# Patient Record
Sex: Male | Born: 1990 | Race: White | Hispanic: No | Marital: Married | Smoking: Never smoker
Health system: Southern US, Community
[De-identification: ages and names within clinical notes are randomized; demographics above are authoritative.]

## PROBLEM LIST (undated history)

## (undated) HISTORY — PX: HERNIA REPAIR: SHX51

## (undated) HISTORY — PX: ADENOIDECTOMY: SUR15

---

## 2018-03-17 ENCOUNTER — Encounter: Payer: Self-pay | Admitting: Emergency Medicine

## 2018-03-17 ENCOUNTER — Other Ambulatory Visit: Payer: Self-pay

## 2018-03-17 ENCOUNTER — Ambulatory Visit
Admission: EM | Admit: 2018-03-17 | Discharge: 2018-03-17 | Disposition: A | Discharge status: Home / Self Care | Payer: 59 | Attending: Family Medicine | Admitting: Family Medicine

## 2018-03-17 DIAGNOSIS — R079 Chest pain, unspecified: Secondary | ICD-10-CM | POA: Diagnosis not present

## 2018-03-17 LAB — CBC WITH DIFFERENTIAL/PLATELET
Abs Immature Granulocytes: 0.02 10*3/uL (ref 0.00–0.07)
BASOS PCT: 1 %
Basophils Absolute: 0 10*3/uL (ref 0.0–0.1)
EOS PCT: 1 %
Eosinophils Absolute: 0.1 10*3/uL (ref 0.0–0.5)
HCT: 49.3 % (ref 39.0–52.0)
HEMOGLOBIN: 16.9 g/dL (ref 13.0–17.0)
Immature Granulocytes: 0 %
LYMPHS PCT: 30 %
Lymphs Abs: 1.9 10*3/uL (ref 0.7–4.0)
MCH: 29 pg (ref 26.0–34.0)
MCHC: 34.3 g/dL (ref 30.0–36.0)
MCV: 84.7 fL (ref 80.0–100.0)
MONO ABS: 0.3 10*3/uL (ref 0.1–1.0)
Monocytes Relative: 5 %
Neutro Abs: 3.9 10*3/uL (ref 1.7–7.7)
Neutrophils Relative %: 63 %
Platelets: 158 10*3/uL (ref 150–400)
RBC: 5.82 MIL/uL — AB (ref 4.22–5.81)
RDW: 11.9 % (ref 11.5–15.5)
WBC: 6.3 10*3/uL (ref 4.0–10.5)
nRBC: 0 % (ref 0.0–0.2)

## 2018-03-17 LAB — COMPREHENSIVE METABOLIC PANEL
ALBUMIN: 4.9 g/dL (ref 3.5–5.0)
ALK PHOS: 27 U/L — AB (ref 38–126)
ALT: 29 U/L (ref 0–44)
ANION GAP: 8 (ref 5–15)
AST: 24 U/L (ref 15–41)
BUN: 13 mg/dL (ref 6–20)
CALCIUM: 9.3 mg/dL (ref 8.9–10.3)
CO2: 27 mmol/L (ref 22–32)
CREATININE: 0.91 mg/dL (ref 0.61–1.24)
Chloride: 103 mmol/L (ref 98–111)
GFR calc Af Amer: >60 mL/min (ref >60)
GFR calc non Af Amer: >60 mL/min (ref >60)
GLUCOSE: 102 mg/dL — AB (ref 70–99)
Potassium: 3.8 mmol/L (ref 3.5–5.1)
SODIUM: 138 mmol/L (ref 135–145)
TOTAL PROTEIN: 7.6 g/dL (ref 6.5–8.1)
Total Bilirubin: 0.9 mg/dL (ref 0.3–1.2)

## 2018-03-17 LAB — TROPONIN I: Troponin I: <0.03 ng/mL (ref <0.03)

## 2018-03-17 MED ORDER — OMEPRAZOLE 20 MG PO CPDR: 40.0000 mg | DELAYED_RELEASE_CAPSULE | Freq: Every day | ORAL | Status: DC

## 2018-03-17 NOTE — Discharge Instructions (Addendum)
Labs were normal.  Likely anxiety with a possible component of GERD.   Increase Omeprazole to 40 mg daily for 2 weeks.  Close follow up.  No vaping.    [facility], [facility], [facility], [facility].

## 2018-03-17 NOTE — ED Provider Notes (Signed)
[facility]    CSN: [id number] Arrival date & time: 03/17/18  0905  History   Chief Complaint Chief Complaint  Patient presents with  . Chest Pain  . Generalized Body Aches   HPI  27 year old male presents with chest pain.  Patient reports intermittent chest pressure and sharp pain over the past 3 to 4 days.  Patient does have some belching and heartburn periodically.  Wife thought that GERD may be contributing and has placed him on Prilosec.  He has been doing this for the past 2 days.  He has had no resolution.  His discomfort is mild to moderate and is worse at night.  No reports of shortness of breath.  He is a non-smoker but does vape.  He has not seen a doctor in years.  Patient also reports some achiness of his left arm as well as left lower leg.  No reports of shortness of breath.  No fever or chills.  No known relieving factors.  No other reported symptoms.  No other complaints.  Of note, patient had a recent loss.  Patient a coworker who died suddenly after having cardiac issues.   PMH, Surgical Hx, Family Hx, Social History reviewed and updated as below.  No significant PMH.  Past Surgical History:  Procedure Laterality Date  . ADENOIDECTOMY    . HERNIA REPAIR     Home Medications    Prior to Admission medications   Medication Sig Start Date End Date Taking? Authorizing Provider  omeprazole (PRILOSEC) 20 MG capsule Take 2 capsules (40 mg total) by mouth daily. 03/17/18   Tommie Sams, DO   Family History Family History  Problem Relation Age of Onset  . Healthy Mother   . GER disease Father    Social History Social History   Tobacco Use  . Smoking status: Never Smoker  . Smokeless tobacco: Current User    Types: Snuff  Substance Use Topics  . Alcohol use: Yes  . Drug use: Never   Allergies   Patient has no known allergies.   Review of Systems Review of Systems  Cardiovascular: Positive for chest pain.  Musculoskeletal:       Left  arm pain, Left leg pain.   Physical Exam Triage Vital Signs ED Triage Vitals  Enc Vitals Group     BP 03/17/18 0921 (!) 174/87     Pulse Rate 03/17/18 0921 (!) 103     Resp 03/17/18 0921 16     Temp 03/17/18 0921 99.1 F (37.3 C)     Temp Source 03/17/18 0921 Oral     SpO2 03/17/18 0921 100 %     Weight 03/17/18 0915 255 lb (115.7 kg)     Height 03/17/18 0915 5\' 11"  (1.803 m)     Head Circumference --      Peak Flow --      Pain Score 03/17/18 0915 2     Pain Loc --      Pain Edu? --      Excl. in GC? --    Updated Vital Signs BP (!) 143/95 (BP Location: Left Arm)   Pulse 97   Temp 99.1 F (37.3 C) (Oral)   Resp 16   Ht 5\' 11"  (1.803 m)   Wt 115.7 kg   SpO2 100%   BMI 35.57 kg/m   Visual Acuity Right Eye Distance:   Left Eye Distance:   Bilateral Distance:    Right Eye Near:   Left Eye  Near:    Bilateral Near:     Physical Exam  Constitutional: He is oriented to person, place, and time. He appears well-developed. No distress.  HENT:  Head: Normocephalic and atraumatic.  Nose: Nose normal.  Eyes: Conjunctivae are normal. Right eye exhibits no discharge. Left eye exhibits no discharge.  Cardiovascular: Regular rhythm. Tachycardia present.  Pulmonary/Chest: Effort normal. He has no wheezes. He has no rales.  Neurological: He is alert and oriented to person, place, and time.  Psychiatric: He has a normal mood and affect. His behavior is normal.  Nursing note and vitals reviewed.  UC Treatments / Results  Labs (all labs ordered are listed, but only abnormal results are displayed) Labs Reviewed  CBC WITH DIFFERENTIAL/PLATELET - Abnormal; Notable for the following components:      Result Value   RBC 5.82 (*)    All other components within normal limits  COMPREHENSIVE METABOLIC PANEL - Abnormal; Notable for the following components:   Glucose, Bld 102 (*)    Alkaline Phosphatase 27 (*)    All other components within normal limits  TROPONIN I     EKG Interpretation: Sinus tachycardia at the rate of 114.  Normal axis.  Normal intervals.  Nonspecific ST changes.  Discussed with cardiology who agrees (Dr. Mariah MillingGollan).  Radiology No results found.  Procedures Procedures (including critical care time)  Medications Ordered in UC Medications - No data to display  Initial Impression / Assessment and Plan / UC Course  I have reviewed the triage vital signs and the nursing notes.  Pertinent labs & imaging results that were available during my care of the patient were reviewed by me and considered in my medical decision making (see chart for details).    27 year old male presents with chest pain.  Laboratory studies essentially normal.  EKG unremarkable.  Reviewed with cardiology.  Increasing omeprazole to 40 mg daily.  Anxiety likely playing a role due to recent loss.  Needs close primary care follow-up.  Final Clinical Impressions(s) / UC Diagnoses   Final diagnoses:  Chest pain, unspecified type     Discharge Instructions     Labs were normal.  Likely anxiety with a possible component of GERD.   Increase Omeprazole to 40 mg daily for 2 weeks.  Close follow up.  No vaping.    [facility], [facility], [facility], [facility].     ED Prescriptions    Medication Sig Dispense Auth. Provider   omeprazole (PRILOSEC) 20 MG capsule Take 2 capsules (40 mg total) by mouth daily.  Tommie Samsook, Ledford Goodson G, DO     Controlled Substance Prescriptions  Controlled Substance Registry consulted? Not Applicable   Tommie SamsCook, Karter Haire G, DO 03/17/18 1102

## 2018-03-17 NOTE — ED Triage Notes (Signed)
Patient c/o left sided chest pain for the past 3-4 days.  Patient states that he started OTC Prilosec for the past 2 days.  Patient also reports achy and tingling in his left lower leg.

## 2018-08-16 ENCOUNTER — Emergency Department
Admission: EM | Admit: 2018-08-16 | Discharge: 2018-08-16 | Disposition: A | Discharge status: Home / Self Care | Payer: No Typology Code available for payment source | Attending: Emergency Medicine | Admitting: Emergency Medicine

## 2018-08-16 ENCOUNTER — Encounter: Payer: Self-pay | Admitting: *Deleted

## 2018-08-16 ENCOUNTER — Other Ambulatory Visit: Payer: Self-pay

## 2018-08-16 ENCOUNTER — Emergency Department: Payer: No Typology Code available for payment source

## 2018-08-16 DIAGNOSIS — F17290 Nicotine dependence, other tobacco product, uncomplicated: Secondary | ICD-10-CM | POA: Diagnosis not present

## 2018-08-16 DIAGNOSIS — R0789 Other chest pain: Secondary | ICD-10-CM | POA: Diagnosis not present

## 2018-08-16 DIAGNOSIS — Z79899 Other long term (current) drug therapy: Secondary | ICD-10-CM | POA: Insufficient documentation

## 2018-08-16 DIAGNOSIS — F419 Anxiety disorder, unspecified: Secondary | ICD-10-CM | POA: Diagnosis not present

## 2018-08-16 DIAGNOSIS — R079 Chest pain, unspecified: Secondary | ICD-10-CM | POA: Diagnosis present

## 2018-08-16 LAB — CBC WITH DIFFERENTIAL/PLATELET
Abs Immature Granulocytes: 0.02 10*3/uL (ref 0.00–0.07)
Basophils Absolute: 0.1 10*3/uL (ref 0.0–0.1)
Basophils Relative: 1 %
Eosinophils Absolute: 0.1 10*3/uL (ref 0.0–0.5)
Eosinophils Relative: 2 %
HCT: 48.1 % (ref 39.0–52.0)
Hemoglobin: 16.4 g/dL (ref 13.0–17.0)
Immature Granulocytes: 0 %
Lymphocytes Relative: 38 %
Lymphs Abs: 2.4 10*3/uL (ref 0.7–4.0)
MCH: 28.5 pg (ref 26.0–34.0)
MCHC: 34.1 g/dL (ref 30.0–36.0)
MCV: 83.7 fL (ref 80.0–100.0)
Monocytes Absolute: 0.4 10*3/uL (ref 0.1–1.0)
Monocytes Relative: 7 %
Neutro Abs: 3.2 10*3/uL (ref 1.7–7.7)
Neutrophils Relative %: 52 %
Platelets: 175 10*3/uL (ref 150–400)
RBC: 5.75 MIL/uL (ref 4.22–5.81)
RDW: 11.9 % (ref 11.5–15.5)
WBC: 6.2 10*3/uL (ref 4.0–10.5)
nRBC: 0 % (ref 0.0–0.2)

## 2018-08-16 LAB — COMPREHENSIVE METABOLIC PANEL
ALT: 37 U/L (ref 0–44)
AST: 25 U/L (ref 15–41)
Albumin: 5.2 g/dL — ABNORMAL HIGH (ref 3.5–5.0)
Alkaline Phosphatase: 29 U/L — ABNORMAL LOW (ref 38–126)
Anion gap: 11 (ref 5–15)
BUN: 12 mg/dL (ref 6–20)
CO2: 26 mmol/L (ref 22–32)
Calcium: 9.3 mg/dL (ref 8.9–10.3)
Chloride: 102 mmol/L (ref 98–111)
Creatinine, Ser: 0.89 mg/dL (ref 0.61–1.24)
GFR calc Af Amer: >60 mL/min (ref >60)
GFR calc non Af Amer: >60 mL/min (ref >60)
Glucose, Bld: 102 mg/dL — ABNORMAL HIGH (ref 70–99)
Potassium: 3.9 mmol/L (ref 3.5–5.1)
Sodium: 139 mmol/L (ref 135–145)
Total Bilirubin: 0.6 mg/dL (ref 0.3–1.2)
Total Protein: 7.7 g/dL (ref 6.5–8.1)

## 2018-08-16 LAB — FIBRIN DERIVATIVES D-DIMER (ARMC ONLY): Fibrin derivatives D-dimer (ARMC): 165.71 ng/mL (FEU) (ref 0.00–499.00)

## 2018-08-16 LAB — LIPASE, BLOOD: Lipase: 31 U/L (ref 11–51)

## 2018-08-16 MED ORDER — SODIUM CHLORIDE 0.9 % IV BOLUS
500.0000 mL | Freq: Once | INTRAVENOUS | Status: AC
  Administered 2018-08-16: 500 mL via INTRAVENOUS

## 2018-08-16 NOTE — ED Notes (Signed)
Pt verbalized understanding of d/c instructions, and f/u care. No further questions at this time. Pt ambulatory to the exit with steady gait.  

## 2018-08-16 NOTE — ED Notes (Addendum)
Pt denies N/V, fever at this time.

## 2018-08-16 NOTE — ED Provider Notes (Signed)
[facility] Emergency Department Provider Note  ____________________________________________  Time seen: Approximately 6:25 PM  I have reviewed the triage vital signs and the nursing notes.   HISTORY  Chief Complaint Chest Pain    HPI Ricky Pratt is a 28 y.o. male with a past medical history of anxiety and GERD who complains of intermittent chest pain for the past week.  He says that it is waxing and waning, no specific aggravating or alleviating factors, at times worse with breathing, at times associated with shortness of breath.  Nonradiating.  Not exertional.  No recent travel trauma hospitalization or surgery.  No history of DVT or PE.      No past medical history on file.   There are no active problems to display for this patient.    Past Surgical History:  Procedure Laterality Date  . ADENOIDECTOMY    . HERNIA REPAIR       Prior to Admission medications   Medication Sig Start Date End Date Taking? Authorizing Provider  omeprazole (PRILOSEC) 20 MG capsule Take 2 capsules (40 mg total) by mouth daily. 03/17/18   Tommie Sams, DO     Allergies Patient has no known allergies.   Family History  Problem Relation Age of Onset  . Healthy Mother   . GER disease Father     Social History Social History   Tobacco Use  . Smoking status: Never Smoker  . Smokeless tobacco: Current User    Types: Snuff  Substance Use Topics  . Alcohol use: Yes  . Drug use: Never    Review of Systems  Constitutional:   No fever or chills.  ENT:   No sore throat. No rhinorrhea. Cardiovascular: Positive as above chest pain without syncope. Respiratory:   No dyspnea or cough. Gastrointestinal:   Negative for abdominal pain, vomiting and diarrhea.  Musculoskeletal:   Negative for focal pain or swelling All other systems reviewed and are negative except as documented above in ROS and  HPI.  ____________________________________________   PHYSICAL EXAM:  VITAL SIGNS: ED Triage Vitals  Enc Vitals Group     BP 08/16/18 1734 (!) 162/106     Pulse Rate 08/16/18 1734 (!) 123     Resp 08/16/18 1734 20     Temp 08/16/18 1734 97.6 F (36.4 C)     Temp Source 08/16/18 1734 Oral     SpO2 08/16/18 1734 100 %     Weight 08/16/18 1733 270 lb (122.5 kg)     Height 08/16/18 1733  (1.803 m)     Head Circumference --      Peak Flow --      Pain Score 08/16/18 1733 2     Pain Loc --      Pain Edu? --      Excl. in GC? --     Vital signs reviewed, nursing assessments reviewed.   Constitutional:   Alert and oriented. Non-toxic appearance. Eyes:   Conjunctivae are normal. EOMI. PERRL. ENT      Head:   Normocephalic and atraumatic.      Nose:   No congestion/rhinnorhea.       Mouth/Throat:   MMM, no pharyngeal erythema. No peritonsillar mass.       Neck:   No meningismus. Full ROM. Hematological/Lymphatic/Immunilogical:   No cervical lymphadenopathy. Cardiovascular:   Tachycardia heart rate 105. Symmetric bilateral radial and DP pulses.  No murmurs. Cap refill less than 2 seconds. Respiratory:   Normal  respiratory effort without tachypnea/retractions. Breath sounds are clear and equal bilaterally. No wheezes/rales/rhonchi. Gastrointestinal:   Soft and nontender. Non distended. There is no CVA tenderness.  No rebound, rigidity, or guarding. Musculoskeletal:   Normal range of motion in all extremities. No joint effusions.  No lower extremity tenderness.  No edema. Neurologic:   Normal speech and language.  Motor grossly intact. No acute focal neurologic deficits are appreciated.  Skin:    Skin is warm, dry and intact. No rash noted.  No petechiae, purpura, or bullae.  ____________________________________________    LABS (pertinent positives/negatives) (all labs ordered are listed, but only abnormal results are displayed) Labs Reviewed  COMPREHENSIVE METABOLIC  PANEL - Abnormal; Notable for the following components:      Result Value   Glucose, Bld 102 (*)    Albumin 5.2 (*)    Alkaline Phosphatase 29 (*)    All other components within normal limits  CBC WITH DIFFERENTIAL/PLATELET  FIBRIN DERIVATIVES D-DIMER ([facility] ONLY)  LIPASE, BLOOD   ____________________________________________   EKG  Interpreted by me Sinus tachycardia rate 123, normal axis and intervals.  Normal QRS ST segments and T waves.  No evidence of right heart strain.  ____________________________________________    RADIOLOGY  Dg Chest Portable 1 View  Result Date: 08/16/2018 CLINICAL DATA:  28 year old male with chest pain EXAM: PORTABLE CHEST 1 VIEW COMPARISON:  None. FINDINGS: Cardiomediastinal silhouette within normal limits in size and contour. No evidence of central vascular congestion. No pneumothorax or pleural effusion. No confluent airspace disease. IMPRESSION: Negative for acute cardiopulmonary disease. Electronically Signed   By: Gilmer MorJaime  Wagner D.O.   On: 08/16/2018 18:37    ____________________________________________   PROCEDURES Procedures  ____________________________________________  DIFFERENTIAL DIAGNOSIS   Anxiety, pleurisy, chest wall pain, GERD, pulmonary embolism  CLINICAL IMPRESSION / ASSESSMENT AND PLAN / ED COURSE  Medications ordered in the ED: Medications  sodium chloride 0.9 % bolus 500 mL (0 mLs Intravenous Stopped 08/16/18 1911)    Pertinent labs & imaging results that were available during my care of the patient were reviewed by me and considered in my medical decision making (see chart for details).  Ricky Pratt was evaluated in Emergency Department on 08/16/2018 for the symptoms described in the history of present illness. He was evaluated in the context of the global COVID-19 pandemic, which necessitated consideration that the patient might be at risk for infection with the SARS-CoV-2 virus that causes COVID-19.  Institutional protocols and algorithms that pertain to the evaluation of patients at risk for COVID-19 are in a state of rapid change based on information released by regulatory bodies including the CDC and federal and state organizations. These policies and algorithms were followed during the patient's care in the ED.   Patient presents with atypical chest pain, intermittent.  He reports previously having similar symptoms which were partially resolved with a 2-week course of omeprazole which she has no longer been taking.  However, given the ongoing nature of the symptoms over the past 5 months according to the patient in total, along with his tachycardia, I will obtain a chest x-ray and d-dimer for further risk stratification.  If negative I think he can be discharged home with supportive care.   ----------------------------------------- 7:14 PM on 08/16/2018 -----------------------------------------  Vital signs remained stable, tachycardia resolved, d-dimer negative, chest x-ray unremarkable, work-up reassuring.  Stable for discharge home.     ____________________________________________   FINAL CLINICAL IMPRESSION(S) / ED DIAGNOSES    Final diagnoses:  Atypical chest  pain  Anxiety     ED Discharge Orders    None      Portions of this note were generated with dragon dictation software. Dictation errors may occur despite best attempts at proofreading.   Sharman Cheek, MD 08/16/18 [phone]

## 2018-08-16 NOTE — ED Notes (Signed)
Pt is calm and cooperative at this time. This RN will continue to monitor pt. No new orders at this time.

## 2018-08-16 NOTE — ED Triage Notes (Signed)
Pt ambulatory to triage.  Pt has intermittent chest pain for 1 week.  Pain radiates into left arm.  Pt reports sob.  Former vaper.  No n/v  Pt alert  Speech clear.

## 2018-08-16 NOTE — Discharge Instructions (Addendum)
Your labs and chest x-ray today were okay.  The symptoms are most likely stress and anxiety related, although muscle spasms of the chest wall can cause similar symptoms.  Take anti-inflammatory medicines, monitor your symptoms, and follow-up with primary care if they do not resolve in the next 24 hours.

## 2018-12-16 ENCOUNTER — Telehealth: Payer: No Typology Code available for payment source | Admitting: Physician Assistant

## 2018-12-16 ENCOUNTER — Encounter (INDEPENDENT_AMBULATORY_CARE_PROVIDER_SITE_OTHER): Payer: Self-pay

## 2018-12-16 DIAGNOSIS — Z20822 Contact with and (suspected) exposure to covid-19: Secondary | ICD-10-CM

## 2018-12-16 DIAGNOSIS — R509 Fever, unspecified: Secondary | ICD-10-CM

## 2018-12-16 NOTE — Progress Notes (Signed)
E-Visit for Corona Virus Screening   Your current symptoms could be consistent with the coronavirus.  Many health care providers can now test patients at their office but not all are.  [facility] has multiple testing sites. For information on our COVID testing locations and hours go to [email or web address]  Please quarantine yourself while awaiting your test results.  We are enrolling you in our MyChart Home Montioring for COVID19 . Daily you will receive a questionnaire within the MyChart website. Our COVID 19 response team willl be monitoriing your responses daily.    COVID-19 is a respiratory illness with symptoms that are similar to the flu. Symptoms are typically mild to moderate, but there have been cases of severe illness and death due to the virus. The following symptoms may appear 2-14 days after exposure: . Fever . Cough . Shortness of breath or difficulty breathing . Chills . Repeated shaking with chills . Muscle pain . Headache . Sore throat . New loss of taste or smell . Fatigue . Congestion or runny nose . Nausea or vomiting . Diarrhea  It is vitally important that if you feel that you have an infection such as this virus or any other virus that you stay home and away from places where you may spread it to others.  You should self-quarantine for 14 days if you have symptoms that could potentially be coronavirus or have been in close contact a with a person diagnosed with COVID-19 within the last 2 weeks. You should avoid contact with people age 65 and older.   You should wear a mask or cloth face covering over your nose and mouth if you must be around other people or animals, including pets (even at home). Try to stay at least 6 feet away from other people. This will protect the people around you.  You may also take acetaminophen (Tylenol) as needed for fever.   Reduce your risk of any infection by using the same precautions used for avoiding the  common cold or flu:  . Wash your hands often with soap and warm water for at least 20 seconds.  If soap and water are not readily available, use an alcohol-based hand sanitizer with at least 60% alcohol.  . If coughing or sneezing, cover your mouth and nose by coughing or sneezing into the elbow areas of your shirt or coat, into a tissue or into your sleeve (not your hands). . Avoid shaking hands with others and consider head nods or verbal greetings only. . Avoid touching your eyes, nose, or mouth with unwashed hands.  . Avoid close contact with people who are sick. . Avoid places or events with large numbers of people in one location, like concerts or sporting events. . Carefully consider travel plans you have or are making. . If you are planning any travel outside or inside the [place], visit the CDC's Travelers' Health webpage for the latest health notices. . If you have some symptoms but not all symptoms, continue to monitor at home and seek medical attention if your symptoms worsen. . If you are having a medical emergency, call 911.  HOME CARE . Only take medications as instructed by your medical team. . Drink plenty of fluids and get plenty of rest. . A steam or ultrasonic humidifier can help if you have congestion.   GET HELP RIGHT AWAY IF YOU HAVE EMERGENCY WARNING SIGNS** FOR COVID-19. If you or someone is showing any of these signs seek emergency medical care immediately. Call   911 or proceed to your closest emergency facility if: . You develop worsening high fever. . Trouble breathing . Bluish lips or face . Persistent pain or pressure in the chest . New confusion . Inability to wake or stay awake . You cough up blood. . Your symptoms become more severe  **This list is not all possible symptoms. Contact your medical provider for any symptoms that are sever or concerning to you.   MAKE SURE YOU   Understand these instructions.  Will watch your condition.  Will get help right  away if you are not doing well or get worse.  Your e-visit answers were reviewed by a board certified advanced clinical practitioner to complete your personal care plan.  Depending on the condition, your plan could have included both over the counter or prescription medications.  If there is a problem please reply once you have received a response from your provider.  Your safety is important to us.  If you have drug allergies check your prescription carefully.    You can use MyChart to ask questions about today's visit, request a non-urgent call back, or ask for a work or school excuse for 24 hours related to this e-Visit. If it has been greater than 24 hours you will need to follow up with your provider, or enter a new e-Visit to address those concerns. You will get an e-mail in the next two days asking about your experience.  I hope that your e-visit has been valuable and will speed your recovery. Thank you for using e-visits.    

## 2018-12-16 NOTE — Progress Notes (Signed)
I have spent 5 minutes in review of e-visit questionnaire, review and updating patient chart, medical decision making and response to patient.   Jhoselyn Ruffini Cody Aric Jost, PA-C    

## 2018-12-17 ENCOUNTER — Telehealth: Payer: Self-pay | Admitting: *Deleted

## 2018-12-17 ENCOUNTER — Encounter (INDEPENDENT_AMBULATORY_CARE_PROVIDER_SITE_OTHER): Payer: Self-pay

## 2018-12-17 NOTE — Telephone Encounter (Signed)
Left a voicemail to call back to [phone] regarding his MyChart Companion answer that his cough is worse today.

## 2018-12-18 ENCOUNTER — Encounter (INDEPENDENT_AMBULATORY_CARE_PROVIDER_SITE_OTHER): Payer: Self-pay

## 2019-11-13 IMAGING — DX PORTABLE CHEST - 1 VIEW
1 series · 1 of 1 positions shown · non-contrast
Comparison: None.

CLINICAL DATA: 27-year-old male with chest pain

EXAM:
PORTABLE CHEST 1 VIEW

[chest ap]
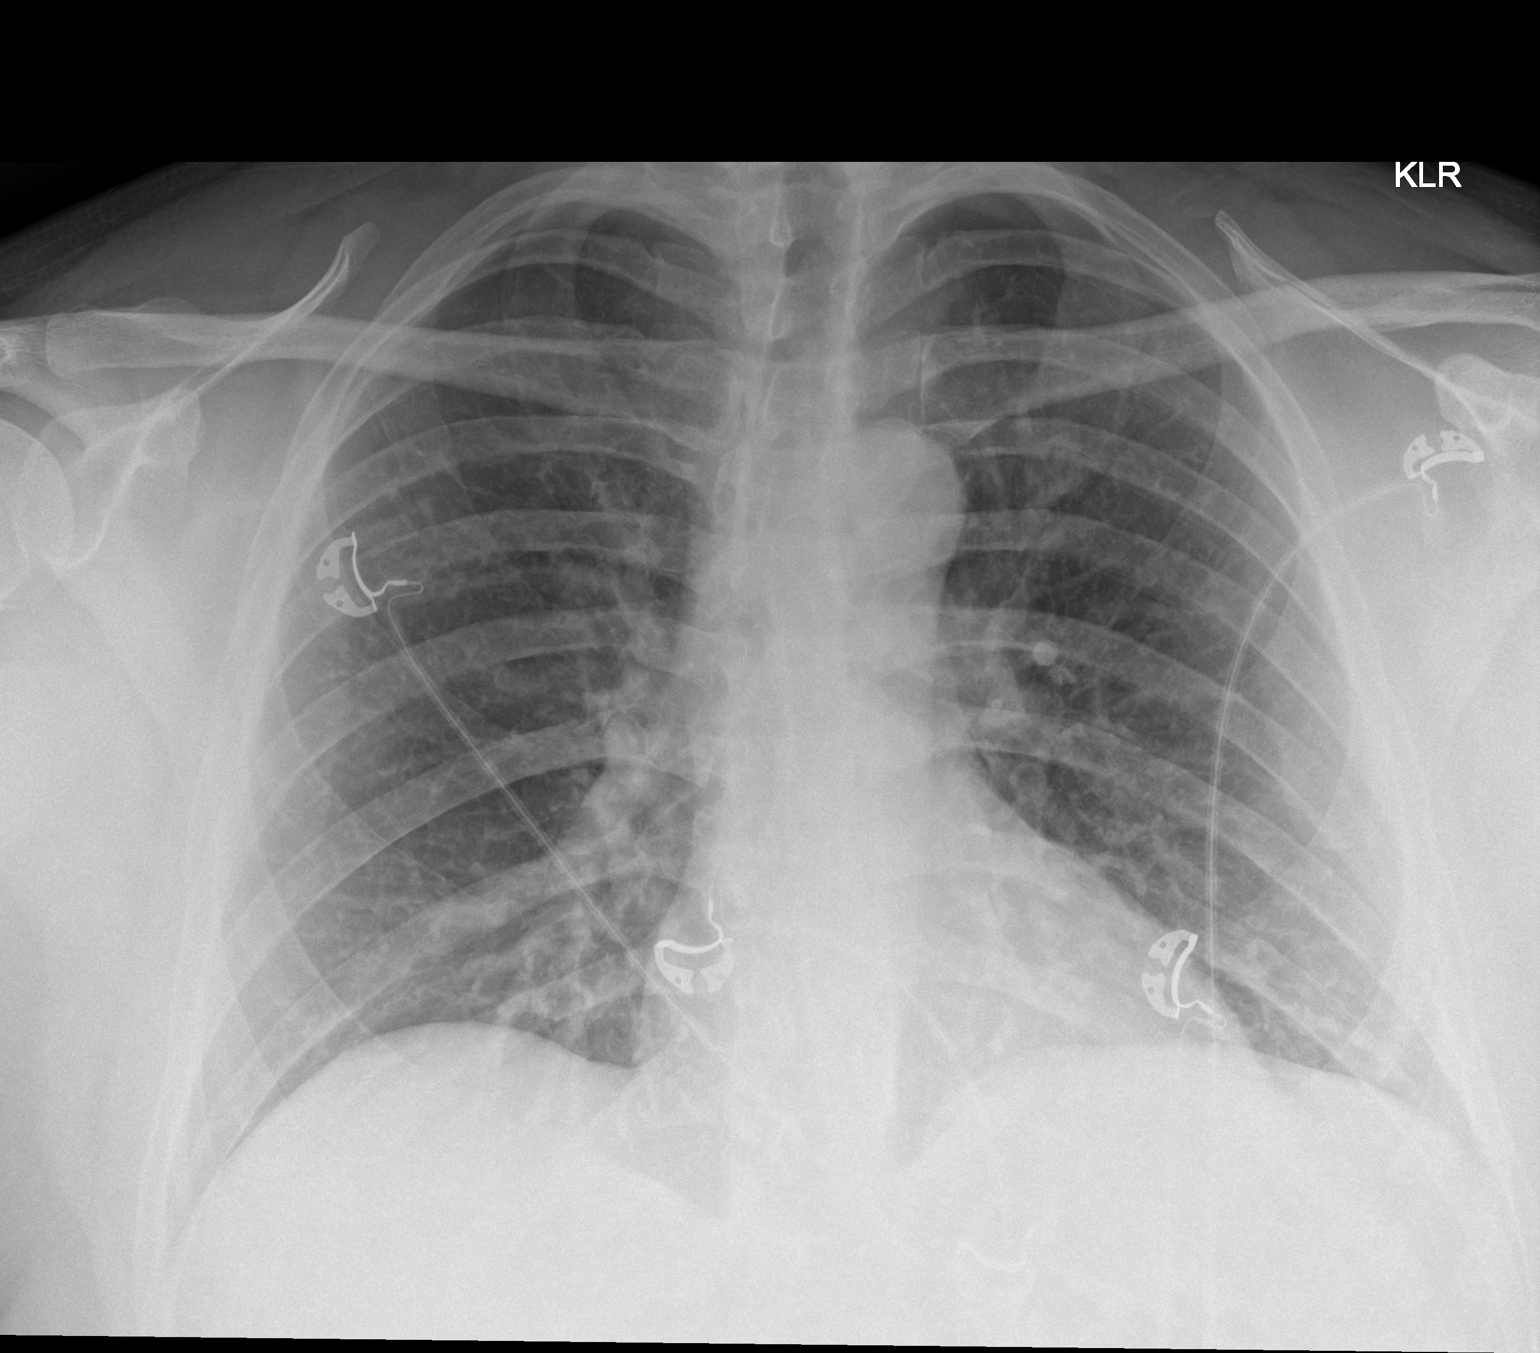

[1 of 1 positions shown; findings below may reference images not displayed]

FINDINGS: Cardiomediastinal silhouette within normal limits in size and
contour. No evidence of central vascular congestion. No pneumothorax
or pleural effusion. No confluent airspace disease.
IMPRESSION: Negative for acute cardiopulmonary disease.

## 2020-03-21 ENCOUNTER — Other Ambulatory Visit (HOSPITAL_COMMUNITY): Payer: Self-pay | Admitting: Family Medicine

## 2020-10-21 ENCOUNTER — Other Ambulatory Visit: Payer: Self-pay

## 2020-10-21 MED FILL — Escitalopram Oxalate Tab 20 MG (Base Equiv): ORAL | 90 days supply | Qty: 90 | Fill #0 | Status: AC

## 2021-01-14 ENCOUNTER — Other Ambulatory Visit: Payer: Self-pay

## 2021-01-14 MED FILL — Escitalopram Oxalate Tab 20 MG (Base Equiv): ORAL | 90 days supply | Qty: 90 | Fill #1 | Status: AC

## 2021-04-13 ENCOUNTER — Other Ambulatory Visit: Payer: Self-pay

## 2021-04-13 MED ORDER — ESCITALOPRAM OXALATE 20 MG PO TABS
ORAL_TABLET | ORAL | 0 refills | Status: DC
  Filled 2021-04-13 – 2021-04-16 (×2): qty 90, 90d supply, fill #0

## 2021-04-16 ENCOUNTER — Other Ambulatory Visit: Payer: Self-pay

## 2021-06-26 ENCOUNTER — Other Ambulatory Visit: Payer: Self-pay

## 2021-06-26 MED ORDER — ESCITALOPRAM OXALATE 10 MG PO TABS
ORAL_TABLET | ORAL | 0 refills | Status: DC
  Filled 2021-06-26 – 2021-07-24 (×2): qty 135, 90d supply, fill #0

## 2021-07-09 ENCOUNTER — Other Ambulatory Visit: Payer: Self-pay

## 2021-07-24 ENCOUNTER — Other Ambulatory Visit: Payer: Self-pay

## 2021-08-24 ENCOUNTER — Other Ambulatory Visit: Payer: Self-pay

## 2021-08-24 MED ORDER — BUPROPION HCL ER (XL) 150 MG PO TB24
ORAL_TABLET | ORAL | 0 refills | Status: DC
  Filled 2021-08-24 – 2021-09-15 (×2): qty 90, 90d supply, fill #0

## 2021-08-24 MED ORDER — ESCITALOPRAM OXALATE 20 MG PO TABS
ORAL_TABLET | ORAL | 3 refills | Status: AC
  Filled 2021-08-24 – 2021-09-15 (×2): qty 90, 90d supply, fill #0
  Filled 2021-12-31: qty 90, 90d supply, fill #1
  Filled 2022-04-01: qty 90, 90d supply, fill #2

## 2021-09-08 ENCOUNTER — Other Ambulatory Visit: Payer: Self-pay

## 2021-09-15 ENCOUNTER — Other Ambulatory Visit: Payer: Self-pay

## 2021-12-07 ENCOUNTER — Other Ambulatory Visit: Payer: Self-pay

## 2021-12-07 MED ORDER — BUPROPION HCL ER (XL) 150 MG PO TB24
ORAL_TABLET | ORAL | 2 refills | Status: DC
  Filled 2021-12-07: qty 90, 90d supply, fill #0

## 2021-12-14 ENCOUNTER — Other Ambulatory Visit: Payer: Self-pay

## 2021-12-31 ENCOUNTER — Other Ambulatory Visit: Payer: Self-pay

## 2022-03-23 ENCOUNTER — Other Ambulatory Visit: Payer: Self-pay

## 2022-04-01 ENCOUNTER — Other Ambulatory Visit: Payer: Self-pay

## 2022-05-31 ENCOUNTER — Other Ambulatory Visit: Payer: Self-pay

## 2022-06-28 ENCOUNTER — Ambulatory Visit
Admission: EM | Admit: 2022-06-28 | Discharge: 2022-06-28 | Disposition: A | Discharge status: Home / Self Care | Payer: BC Managed Care – PPO | Attending: Urgent Care | Admitting: Urgent Care

## 2022-06-28 DIAGNOSIS — B9689 Other specified bacterial agents as the cause of diseases classified elsewhere: Secondary | ICD-10-CM

## 2022-06-28 DIAGNOSIS — J028 Acute pharyngitis due to other specified organisms: Secondary | ICD-10-CM

## 2022-06-28 LAB — POCT RAPID STREP A (OFFICE): Rapid Strep A Screen: POSITIVE — AB

## 2022-06-28 MED ORDER — AMOXICILLIN 500 MG PO CAPS: 500.0000 mg | ORAL_CAPSULE | Freq: Two times a day (BID) | ORAL | 0 refills | Status: AC

## 2022-06-28 NOTE — Discharge Instructions (Addendum)
Follow up here or with your primary care provider if your symptoms are worsening or not improving with treatment.          

## 2022-06-28 NOTE — ED Triage Notes (Signed)
Patient presents to UC for sore throat since today.Treating symptoms with sinus meds and ibuprofen.   Denies fever.

## 2022-06-28 NOTE — ED Provider Notes (Signed)
Roderic Palau    CSN: [id number] Arrival date & time: 06/28/22  1608      History   Chief Complaint Chief Complaint  Patient presents with   Sore Throat    Entered by patient    HPI Aziah Tippie is a 32 y.o. male.    Sore Throat    Presents to urgent care with complaint of sore throat starting today.  Denies fever.  History reviewed. No pertinent past medical history.  There are no problems to display for this patient.   Past Surgical History:  Procedure Laterality Date   ADENOIDECTOMY     HERNIA REPAIR         Home Medications    Prior to Admission medications   Medication Sig Start Date End Date Taking? Authorizing Provider  buPROPion (WELLBUTRIN XL) 150 MG 24 hr tablet Take 1 tablet (150 mg total) by mouth once daily 12/07/21     escitalopram (LEXAPRO) 10 MG tablet  10/26/18   [provider]  escitalopram (LEXAPRO) 20 MG tablet Take 1 tablet (20 mg total) by mouth once daily for 90 days 04/13/21     escitalopram (LEXAPRO) 20 MG tablet Take 1 tablet (20 mg total) by mouth once daily for 90 days 08/24/21     omeprazole (PRILOSEC) 20 MG capsule Take 2 capsules (40 mg total) by mouth daily. 03/17/18   Coral Spikes, DO    Family History Family History  Problem Relation Age of Onset   Healthy Mother    GER disease Father     Social History Social History   Tobacco Use   Smoking status: Never   Smokeless tobacco: Current    Types: Snuff  Vaping Use   Vaping Use: Every day   Substances: Nicotine, Flavoring  Substance Use Topics   Alcohol use: Yes   Drug use: Never     Allergies   Patient has no known allergies.   Review of Systems Review of Systems   Physical Exam Triage Vital Signs ED Triage Vitals [06/28/22 1711]  Enc Vitals Group     BP 119/83     Pulse Rate 96     Resp 18     Temp 98.7 F (37.1 C)     Temp Source Oral     SpO2 96 %     Weight      Height      Head Circumference      Peak Flow       Pain Score      Pain Loc      Pain Edu?      Excl. in [place]?    No data found.  Updated Vital Signs BP 119/83 (BP Location: Right Arm)   Pulse 96   Temp 98.7 F (37.1 C) (Oral)   Resp 18   SpO2 96%   Visual Acuity Right Eye Distance:   Left Eye Distance:   Bilateral Distance:    Right Eye Near:   Left Eye Near:    Bilateral Near:     Physical Exam Vitals reviewed.  Constitutional:      Appearance: He is well-developed.  HENT:     Mouth/Throat:     Pharynx: Posterior oropharyngeal erythema present. No oropharyngeal exudate.     Tonsils: No tonsillar exudate. 3+ on the right. 3+ on the left.  Skin:    General: Skin is warm and dry.  Neurological:     General: No focal deficit present.  Mental Status: He is alert and oriented to person, place, and time.  Psychiatric:        Mood and Affect: Mood normal.      UC Treatments / Results  Labs (all labs ordered are listed, but only abnormal results are displayed) Labs Reviewed  POCT RAPID STREP A (OFFICE)    EKG   Radiology No results found.  Procedures Procedures (including critical care time)  Medications Ordered in UC Medications - No data to display  Initial Impression / Assessment and Plan / UC Course  I have reviewed the triage vital signs and the nursing notes.  Pertinent labs & imaging results that were available during my care of the patient were reviewed by me and considered in my medical decision making (see chart for details).   Patient is afebrile here without recent antipyretics. Satting well on room air. Overall is well appearing, well hydrated, without respiratory distress.  There is pharyngeal erythema but without peritonsillar exudates.  Tonsils are 3+ bilaterally.  Suspect bacterial pharyngitis and will obtain rapid strep.  Rapid strep is positive. Will treat with amox.    Final Clinical Impressions(s) / UC Diagnoses   Final diagnoses:  None   Discharge Instructions    None    ED Prescriptions   None    PDMP not reviewed this encounter.   Rose Phi, [place] 06/28/22 1726

## 2022-06-29 ENCOUNTER — Other Ambulatory Visit: Payer: Self-pay

## 2022-12-27 ENCOUNTER — Other Ambulatory Visit: Payer: Self-pay

## 2022-12-27 MED ORDER — WEGOVY 0.5 MG/0.5ML ~~LOC~~ SOAJ
0.5000 mg | SUBCUTANEOUS | 1 refills | Status: AC
  Filled 2022-12-27: qty 2, 28d supply, fill #0

## 2023-01-11 ENCOUNTER — Other Ambulatory Visit: Payer: Self-pay

## 2023-01-11 MED ORDER — WEGOVY 1 MG/0.5ML ~~LOC~~ SOAJ
1.0000 mg | SUBCUTANEOUS | 0 refills | Status: DC
  Filled 2023-01-11 – 2023-01-14 (×2): qty 2, 28d supply, fill #0

## 2023-01-14 ENCOUNTER — Other Ambulatory Visit: Payer: Self-pay

## 2023-02-10 ENCOUNTER — Other Ambulatory Visit: Payer: Self-pay

## 2023-02-10 MED ORDER — WEGOVY 1.7 MG/0.75ML ~~LOC~~ SOAJ
1.7000 mg | SUBCUTANEOUS | 0 refills | Status: DC
  Filled 2023-02-10: qty 3, 28d supply, fill #0

## 2023-02-16 ENCOUNTER — Other Ambulatory Visit: Payer: Self-pay

## 2023-03-11 ENCOUNTER — Other Ambulatory Visit: Payer: Self-pay

## 2023-03-11 MED ORDER — WEGOVY 2.4 MG/0.75ML ~~LOC~~ SOAJ
2.4000 mg | SUBCUTANEOUS | 3 refills | Status: DC
  Filled 2023-03-11: qty 3, 28d supply, fill #0
  Filled 2023-04-11: qty 3, 28d supply, fill #1
  Filled 2023-05-09: qty 3, 28d supply, fill #2
  Filled 2023-06-06: qty 3, 28d supply, fill #3

## 2023-03-18 ENCOUNTER — Observation Stay: Payer: BC Managed Care – PPO | Admitting: Anesthesiology

## 2023-03-18 ENCOUNTER — Encounter: Payer: Self-pay | Admitting: General Surgery

## 2023-03-18 ENCOUNTER — Emergency Department: Payer: BC Managed Care – PPO

## 2023-03-18 ENCOUNTER — Observation Stay
Admission: EM | Admit: 2023-03-18 | Discharge: 2023-03-18 | Disposition: A | Discharge status: Home / Self Care | Payer: BC Managed Care – PPO | Attending: General Surgery | Admitting: General Surgery

## 2023-03-18 ENCOUNTER — Encounter
Admission: EM | Disposition: A | Discharge status: Home / Self Care | Payer: Self-pay | Source: Home / Self Care | Attending: Emergency Medicine

## 2023-03-18 ENCOUNTER — Other Ambulatory Visit: Payer: Self-pay

## 2023-03-18 DIAGNOSIS — K3580 Unspecified acute appendicitis: Secondary | ICD-10-CM | POA: Diagnosis not present

## 2023-03-18 DIAGNOSIS — R1031 Right lower quadrant pain: Secondary | ICD-10-CM | POA: Diagnosis present

## 2023-03-18 HISTORY — PX: LAPAROSCOPIC APPENDECTOMY: SHX408

## 2023-03-18 LAB — COMPREHENSIVE METABOLIC PANEL
ALT: 26 U/L (ref 0–44)
AST: 20 U/L (ref 15–41)
Albumin: 4.9 g/dL (ref 3.5–5.0)
Alkaline Phosphatase: 32 U/L — ABNORMAL LOW (ref 38–126)
Anion gap: 10 (ref 5–15)
BUN: 12 mg/dL (ref 6–20)
CO2: 25 mmol/L (ref 22–32)
Calcium: 9.4 mg/dL (ref 8.9–10.3)
Chloride: 102 mmol/L (ref 98–111)
Creatinine, Ser: 1.01 mg/dL (ref 0.61–1.24)
GFR, Estimated: >60 mL/min (ref >60)
Glucose, Bld: 95 mg/dL (ref 70–99)
Potassium: 3.3 mmol/L — ABNORMAL LOW (ref 3.5–5.1)
Sodium: 137 mmol/L (ref 135–145)
Total Bilirubin: 0.9 mg/dL (ref <1.2)
Total Protein: 7.5 g/dL (ref 6.5–8.1)

## 2023-03-18 LAB — URINALYSIS, ROUTINE W REFLEX MICROSCOPIC
Bilirubin Urine: NEGATIVE
Glucose, UA: NEGATIVE mg/dL
Hgb urine dipstick: NEGATIVE
Ketones, ur: 20 mg/dL — AB
Leukocytes,Ua: NEGATIVE
Nitrite: NEGATIVE
Protein, ur: NEGATIVE mg/dL
Specific Gravity, Urine: 1.016 (ref 1.005–1.030)
pH: 5 (ref 5.0–8.0)

## 2023-03-18 LAB — CBC WITH DIFFERENTIAL/PLATELET
Abs Immature Granulocytes: 0.06 10*3/uL (ref 0.00–0.07)
Basophils Absolute: 0.1 10*3/uL (ref 0.0–0.1)
Basophils Relative: 0 %
Eosinophils Absolute: 0.1 10*3/uL (ref 0.0–0.5)
Eosinophils Relative: 1 %
HCT: 46.9 % (ref 39.0–52.0)
Hemoglobin: 16.3 g/dL (ref 13.0–17.0)
Immature Granulocytes: 0 %
Lymphocytes Relative: 18 %
Lymphs Abs: 2.6 10*3/uL (ref 0.7–4.0)
MCH: 28.6 pg (ref 26.0–34.0)
MCHC: 34.8 g/dL (ref 30.0–36.0)
MCV: 82.4 fL (ref 80.0–100.0)
Monocytes Absolute: 0.7 10*3/uL (ref 0.1–1.0)
Monocytes Relative: 5 %
Neutro Abs: 11 10*3/uL — ABNORMAL HIGH (ref 1.7–7.7)
Neutrophils Relative %: 76 %
Platelets: 200 10*3/uL (ref 150–400)
RBC: 5.69 MIL/uL (ref 4.22–5.81)
RDW: 11.8 % (ref 11.5–15.5)
WBC: 14.5 10*3/uL — ABNORMAL HIGH (ref 4.0–10.5)
nRBC: 0 % (ref 0.0–0.2)

## 2023-03-18 LAB — LIPASE, BLOOD: Lipase: 37 U/L (ref 11–51)

## 2023-03-18 SURGERY — APPENDECTOMY, LAPAROSCOPIC
Anesthesia: General

## 2023-03-18 MED ORDER — DEXAMETHASONE SODIUM PHOSPHATE 10 MG/ML IJ SOLN
INTRAMUSCULAR | Status: DC | PRN
  Administered 2023-03-18: 10 mg via INTRAVENOUS

## 2023-03-18 MED ORDER — BUPIVACAINE-EPINEPHRINE 0.5% -1:200000 IJ SOLN
INTRAMUSCULAR | Status: DC | PRN
  Administered 2023-03-18: 10 mL

## 2023-03-18 MED ORDER — CEFAZOLIN SODIUM 1 G IJ SOLR
INTRAMUSCULAR | Status: AC
  Filled 2023-03-18: qty 10

## 2023-03-18 MED ORDER — OXYCODONE HCL 5 MG PO TABS
5.0000 mg | ORAL_TABLET | ORAL | Status: DC | PRN
  Administered 2023-03-18: 10 mg via ORAL
  Filled 2023-03-18: qty 2

## 2023-03-18 MED ORDER — ROCURONIUM BROMIDE 10 MG/ML (PF) SYRINGE
PREFILLED_SYRINGE | INTRAVENOUS | Status: AC
  Filled 2023-03-18: qty 10

## 2023-03-18 MED ORDER — FENTANYL CITRATE (PF) 100 MCG/2ML IJ SOLN
INTRAMUSCULAR | Status: DC | PRN
  Administered 2023-03-18: 100 ug via INTRAVENOUS

## 2023-03-18 MED ORDER — DEXMEDETOMIDINE HCL IN NACL 80 MCG/20ML IV SOLN
INTRAVENOUS | Status: DC | PRN
  Administered 2023-03-18: 12 ug via INTRAVENOUS

## 2023-03-18 MED ORDER — ACETAMINOPHEN 500 MG PO TABS
1000.0000 mg | ORAL_TABLET | Freq: Four times a day (QID) | ORAL | Status: DC
  Administered 2023-03-18: 1000 mg via ORAL
  Filled 2023-03-18: qty 2

## 2023-03-18 MED ORDER — OXYCODONE HCL 5 MG PO TABS: 5.0000 mg | ORAL_TABLET | Freq: Four times a day (QID) | ORAL | 0 refills | Status: DC | PRN

## 2023-03-18 MED ORDER — LIDOCAINE HCL (CARDIAC) PF 100 MG/5ML IV SOSY
PREFILLED_SYRINGE | INTRAVENOUS | Status: DC | PRN
  Administered 2023-03-18: 100 mg via INTRAVENOUS

## 2023-03-18 MED ORDER — SUGAMMADEX SODIUM 200 MG/2ML IV SOLN
INTRAVENOUS | Status: DC | PRN
  Administered 2023-03-18 (×2): 100 mg via INTRAVENOUS

## 2023-03-18 MED ORDER — ONDANSETRON HCL 4 MG/2ML IJ SOLN
INTRAMUSCULAR | Status: AC
  Filled 2023-03-18: qty 2

## 2023-03-18 MED ORDER — MIDAZOLAM HCL 2 MG/2ML IJ SOLN
INTRAMUSCULAR | Status: DC | PRN
  Administered 2023-03-18: 2 mg via INTRAVENOUS

## 2023-03-18 MED ORDER — ROCURONIUM BROMIDE 100 MG/10ML IV SOLN
INTRAVENOUS | Status: DC | PRN
  Administered 2023-03-18: 60 mg via INTRAVENOUS

## 2023-03-18 MED ORDER — ONDANSETRON HCL 4 MG/2ML IJ SOLN
4.0000 mg | Freq: Once | INTRAMUSCULAR | Status: DC | PRN
Start: 2023-03-18 — End: 2023-03-18

## 2023-03-18 MED ORDER — ONDANSETRON HCL 4 MG/2ML IJ SOLN
INTRAMUSCULAR | Status: DC | PRN
  Administered 2023-03-18: 4 mg via INTRAVENOUS

## 2023-03-18 MED ORDER — FENTANYL CITRATE (PF) 100 MCG/2ML IJ SOLN
INTRAMUSCULAR | Status: AC
  Filled 2023-03-18: qty 2

## 2023-03-18 MED ORDER — SODIUM CHLORIDE 0.9 % IV SOLN
2.0000 g | Freq: Every day | INTRAVENOUS | Status: DC
  Administered 2023-03-18: 2 g via INTRAVENOUS
  Filled 2023-03-18: qty 20

## 2023-03-18 MED ORDER — PHENYLEPHRINE 80 MCG/ML (10ML) SYRINGE FOR IV PUSH (FOR BLOOD PRESSURE SUPPORT)
PREFILLED_SYRINGE | INTRAVENOUS | Status: DC | PRN
  Administered 2023-03-18 (×2): 80 ug via INTRAVENOUS

## 2023-03-18 MED ORDER — DEXMEDETOMIDINE HCL IN NACL 80 MCG/20ML IV SOLN
INTRAVENOUS | Status: AC
  Filled 2023-03-18: qty 20

## 2023-03-18 MED ORDER — SODIUM CHLORIDE 0.9 % IV SOLN: 1.0000 g | Freq: Once | INTRAVENOUS | Status: DC

## 2023-03-18 MED ORDER — OXYCODONE HCL 5 MG PO TABS
ORAL_TABLET | ORAL | Status: AC
  Filled 2023-03-18: qty 1

## 2023-03-18 MED ORDER — BUPIVACAINE LIPOSOME 1.3 % IJ SUSP
INTRAMUSCULAR | Status: AC
  Filled 2023-03-18: qty 10

## 2023-03-18 MED ORDER — LIDOCAINE HCL (PF) 2 % IJ SOLN
INTRAMUSCULAR | Status: AC
  Filled 2023-03-18: qty 5

## 2023-03-18 MED ORDER — OXYCODONE HCL 5 MG/5ML PO SOLN: 5.0000 mg | Freq: Once | ORAL | Status: AC | PRN

## 2023-03-18 MED ORDER — KETOROLAC TROMETHAMINE 30 MG/ML IJ SOLN
INTRAMUSCULAR | Status: DC | PRN
  Administered 2023-03-18: 30 mg via INTRAVENOUS

## 2023-03-18 MED ORDER — FENTANYL CITRATE (PF) 100 MCG/2ML IJ SOLN
25.0000 ug | INTRAMUSCULAR | Status: DC | PRN
  Administered 2023-03-18: 25 ug via INTRAVENOUS

## 2023-03-18 MED ORDER — KETOROLAC TROMETHAMINE 15 MG/ML IJ SOLN
15.0000 mg | Freq: Once | INTRAMUSCULAR | Status: AC
  Administered 2023-03-18: 15 mg via INTRAVENOUS
  Filled 2023-03-18: qty 1

## 2023-03-18 MED ORDER — LACTATED RINGERS IV SOLN: INTRAVENOUS | Status: DC | PRN

## 2023-03-18 MED ORDER — MIDAZOLAM HCL 2 MG/2ML IJ SOLN
INTRAMUSCULAR | Status: AC
Start: 2023-03-18 — End: ?
  Filled 2023-03-18: qty 2

## 2023-03-18 MED ORDER — KETOROLAC TROMETHAMINE 30 MG/ML IJ SOLN
INTRAMUSCULAR | Status: AC
  Filled 2023-03-18: qty 1

## 2023-03-18 MED ORDER — OXYCODONE HCL 5 MG PO TABS
5.0000 mg | ORAL_TABLET | Freq: Once | ORAL | Status: AC | PRN
Start: 2023-03-18 — End: 2023-03-18
  Administered 2023-03-18: 5 mg via ORAL

## 2023-03-18 MED ORDER — MORPHINE SULFATE (PF) 4 MG/ML IV SOLN
4.0000 mg | Freq: Once | INTRAVENOUS | Status: AC
  Administered 2023-03-18: 4 mg via INTRAVENOUS
  Filled 2023-03-18: qty 1

## 2023-03-18 MED ORDER — BUPIVACAINE-EPINEPHRINE (PF) 0.5% -1:200000 IJ SOLN
INTRAMUSCULAR | Status: AC
  Filled 2023-03-18: qty 10

## 2023-03-18 MED ORDER — EPHEDRINE 5 MG/ML INJ
INTRAVENOUS | Status: AC
Start: 2023-03-18 — End: ?
  Filled 2023-03-18: qty 5

## 2023-03-18 MED ORDER — ONDANSETRON HCL 4 MG/2ML IJ SOLN
4.0000 mg | Freq: Once | INTRAMUSCULAR | Status: AC
Start: 2023-03-18 — End: 2023-03-18
  Administered 2023-03-18: 4 mg via INTRAVENOUS
  Filled 2023-03-18: qty 2

## 2023-03-18 MED ORDER — PROPOFOL 10 MG/ML IV BOLUS
INTRAVENOUS | Status: DC | PRN
  Administered 2023-03-18: 130 mg via INTRAVENOUS

## 2023-03-18 MED ORDER — METRONIDAZOLE 500 MG/100ML IV SOLN
500.0000 mg | Freq: Two times a day (BID) | INTRAVENOUS | Status: DC
  Administered 2023-03-18: 500 mg via INTRAVENOUS
  Filled 2023-03-18: qty 100

## 2023-03-18 MED ORDER — POTASSIUM CHLORIDE 2 MEQ/ML IV SOLN
INTRAVENOUS | Status: DC
  Filled 2023-03-18: qty 1000

## 2023-03-18 MED ORDER — SODIUM CHLORIDE FLUSH 0.9 % IV SOLN
INTRAVENOUS | Status: AC
  Filled 2023-03-18: qty 10

## 2023-03-18 MED ORDER — BUPIVACAINE LIPOSOME 1.3 % IJ SUSP
INTRAMUSCULAR | Status: DC | PRN
  Administered 2023-03-18: 10 mL

## 2023-03-18 MED ORDER — DEXAMETHASONE SODIUM PHOSPHATE 10 MG/ML IJ SOLN
INTRAMUSCULAR | Status: AC
  Filled 2023-03-18: qty 1

## 2023-03-18 MED ORDER — PROPOFOL 10 MG/ML IV BOLUS
INTRAVENOUS | Status: AC
  Filled 2023-03-18: qty 20

## 2023-03-18 MED ORDER — CEFAZOLIN SODIUM-DEXTROSE 2-3 GM-%(50ML) IV SOLR
INTRAVENOUS | Status: DC | PRN
  Administered 2023-03-18: 2 g via INTRAVENOUS

## 2023-03-18 MED ORDER — IOHEXOL 350 MG/ML SOLN
100.0000 mL | Freq: Once | INTRAVENOUS | Status: AC | PRN
  Administered 2023-03-18: 100 mL via INTRAVENOUS

## 2023-03-18 MED ORDER — METRONIDAZOLE 500 MG/100ML IV SOLN: 500.0000 mg | Freq: Once | INTRAVENOUS | Status: DC

## 2023-03-18 SURGICAL SUPPLY — 46 items
APPLIER CLIP ROT 10 11.4 M/L (STAPLE)
CLIP APPLIE ROT 10 11.4 M/L (STAPLE) IMPLANT
CORD MONOPOLAR M/FML 12FT (MISCELLANEOUS) ×1 IMPLANT
CUTTER FLEX LINEAR 45M (STAPLE) IMPLANT
DERMABOND ADVANCED .7 DNX12 (GAUZE/BANDAGES/DRESSINGS) ×1 IMPLANT
DRAIN CHANNEL JP 19F (MISCELLANEOUS) IMPLANT
ELECT REM PT RETURN 9FT ADLT (ELECTROSURGICAL) ×1
ELECTRODE REM PT RTRN 9FT ADLT (ELECTROSURGICAL) ×1 IMPLANT
EVACUATOR SILICONE 100CC (DRAIN) IMPLANT
GLOVE BIOGEL PI IND STRL 7.5 (GLOVE) ×1 IMPLANT
GLOVE SURG SYN 7.0 (GLOVE) ×1 IMPLANT
GLOVE SURG SYN 7.0 PF PI (GLOVE) ×1 IMPLANT
GOWN STRL REUS W/ TWL LRG LVL3 (GOWN DISPOSABLE) ×2 IMPLANT
GOWN STRL REUS W/TWL LRG LVL3 (GOWN DISPOSABLE) ×2
GRASPER SUT TROCAR 14GX15 (MISCELLANEOUS) ×1 IMPLANT
IRRIGATION STRYKERFLOW (MISCELLANEOUS) IMPLANT
IRRIGATOR STRYKERFLOW (MISCELLANEOUS)
IV NS 1000ML (IV SOLUTION)
IV NS 1000ML BAXH (IV SOLUTION) IMPLANT
KIT PINK PAD W/HEAD ARE REST (MISCELLANEOUS) ×1 IMPLANT
KIT PINK PAD W/HEAD ARM REST (MISCELLANEOUS) ×1 IMPLANT
KIT TURNOVER KIT A (KITS) ×1 IMPLANT
MANIFOLD NEPTUNE II (INSTRUMENTS) ×1 IMPLANT
NDL HYPO 22X1.5 SAFETY MO (MISCELLANEOUS) ×1 IMPLANT
NDL INSUFFLATION 14GA 120MM (NEEDLE) ×1 IMPLANT
NEEDLE HYPO 22X1.5 SAFETY MO (MISCELLANEOUS) ×1 IMPLANT
NEEDLE INSUFFLATION 14GA 120MM (NEEDLE) ×1 IMPLANT
NS IRRIG 500ML POUR BTL (IV SOLUTION) ×1 IMPLANT
PACK LAP CHOLECYSTECTOMY (MISCELLANEOUS) ×1 IMPLANT
RELOAD 45 VASCULAR/THIN (ENDOMECHANICALS) ×2 IMPLANT
RELOAD STAPLE 45 2.5 WHT GRN (ENDOMECHANICALS) IMPLANT
RELOAD STAPLE 45 3.5 BLU ETS (ENDOMECHANICALS) IMPLANT
RELOAD STAPLE TA45 3.5 REG BLU (ENDOMECHANICALS) ×1 IMPLANT
SCISSORS METZENBAUM CVD 33 (INSTRUMENTS) IMPLANT
SET TUBE SMOKE EVAC HIGH FLOW (TUBING) ×1 IMPLANT
SLEEVE Z-THREAD 5X100MM (TROCAR) ×1 IMPLANT
SUT ETHILON 2 0 FS 18 (SUTURE) IMPLANT
SUT MNCRL AB 4-0 PS2 18 (SUTURE) ×1 IMPLANT
SUT VICRYL 0 AB UR-6 (SUTURE) ×1 IMPLANT
SYS BAG RETRIEVAL 10MM (BASKET) ×1
SYSTEM BAG RETRIEVAL 10MM (BASKET) ×1 IMPLANT
TRAP FLUID SMOKE EVACUATOR (MISCELLANEOUS) ×1 IMPLANT
TRAY FOLEY MTR SLVR 16FR STAT (SET/KITS/TRAYS/PACK) ×1 IMPLANT
TROCAR Z-THREAD FIOS 12X100MM (TROCAR) ×1 IMPLANT
TROCAR Z-THREAD FIOS 5X100MM (TROCAR) ×1 IMPLANT
WATER STERILE IRR 500ML POUR (IV SOLUTION) ×1 IMPLANT

## 2023-03-18 NOTE — Anesthesia Postprocedure Evaluation (Signed)
Anesthesia Post Note  Patient: Ricky Pratt [facility]  Procedure(s) Performed: APPENDECTOMY LAPAROSCOPIC  Patient location during evaluation: PACU Anesthesia Type: General Level of consciousness: awake Pain management: pain level controlled Vital Signs Assessment: post-procedure vital signs reviewed and stable Respiratory status: spontaneous breathing Cardiovascular status: blood pressure returned to baseline Anesthetic complications: no   No notable events documented.   Last Vitals:  Vitals:   03/18/23 1402 03/18/23 1409  BP: (!) 179/87   Pulse: 100 (!) 106  Resp:  19  Temp:    SpO2: 99% 97%    Last Pain:  Vitals:   03/18/23 1409  TempSrc:   PainSc: 7                  VAN STAVEREN,Shaan Rhoads

## 2023-03-18 NOTE — Anesthesia Procedure Notes (Signed)
Procedure Name: Intubation Date/Time: 03/18/2023 12:23 PM  Performed by: Jannet Mantis, CRNAPre-anesthesia Checklist: Patient identified, Emergency Drugs available, Suction available and Patient being monitored Patient Re-evaluated:Patient Re-evaluated prior to induction Oxygen Delivery Method: Circle system utilized Preoxygenation: Pre-oxygenation with 100% oxygen Induction Type: IV induction Ventilation: Mask ventilation without difficulty Laryngoscope Size: McGrath and 4 Tube type: Oral Tube size: 7.0 mm Number of attempts: 1 Airway Equipment and Method: Stylet Placement Confirmation: ETT inserted through vocal cords under direct vision, positive ETCO2 and breath sounds checked- equal and bilateral Secured at: 22 cm Tube secured with: Tape Dental Injury: Teeth and Oropharynx as per pre-operative assessment

## 2023-03-18 NOTE — Discharge Summary (Signed)
Patient ID: Ricky Pratt MRN: [id number] DOB/AGE: [birth year] 31 y.o.  Admit date: 03/18/2023 Discharge date: 03/18/2023   Discharge Diagnoses:  Principal Problem:   Acute appendicitis   Procedures:lap appy  Hospital Course:   admitted with findings consistent with acute appendicits and  was taken promptly to the operating room for an uneventful laparoscopic appendectomy.  The time of discharge the patient was ambulating,  pain was controlled.  His vital signs were stable and she was afebrile.   physical exam at discharge showed a pt  in no acute distress.  Awake and alert.  Abdomen: Soft incisions healing well without infection or peritonitis.  Extremities well-perfused and no edema.  Condition of the patient the time of discharge was stable   Consults: None  Disposition: Discharge disposition: 01-Home or Self Care       Discharge Instructions     Call MD for:  difficulty breathing, headache or visual disturbances   Complete by: As directed    Call MD for:  extreme fatigue   Complete by: As directed    Call MD for:  hives   Complete by: As directed    Call MD for:  persistant dizziness or light-headedness   Complete by: As directed    Call MD for:  persistant nausea and vomiting   Complete by: As directed    Call MD for:  redness, tenderness, or signs of infection (pain, swelling, redness, odor or green/yellow discharge around incision site)   Complete by: As directed    Call MD for:  severe uncontrolled pain   Complete by: As directed    Call MD for:  temperature >100.4   Complete by: As directed    Diet - low sodium heart healthy   Complete by: As directed    Discharge instructions   Complete by: As directed    You have surgical glue over your incisions.  You may shower in 24 hours; please let the warm water run over the incisions and pat dry.  Please refrain from submerging the wounds in water for 2 weeks.  You can take Tylenol and ibuprofen as needed  for pain.  Follow-up in the office in 2 weeks for postoperative visit.  No lifting greater than 10 to 15 pounds for 4 weeks   Increase activity slowly   Complete by: As directed         Follow-up Information     Donovan Kail, PA-C. Go on 04/06/2023.   Specialty: Physician Assistant Why: Go to appointment on 12/04 at 130 PM Contact information: [address] [place] [address] [phone]                  Baker Pierini, M.D.

## 2023-03-18 NOTE — ED Provider Notes (Signed)
[facility] Provider Note    Event Date/Time   First MD Initiated Contact with Patient 03/18/23 [id number]     (approximate)   History   Abdominal Pain   HPI  Ricky Pratt is a 32 y.o. male   Past medical history of otherwise healthy young man who presents to the emergency department with right lower quadrant abdominal pain for the last several hours.  Acute onset after waking from a nap.    Associated with nausea and vomiting.    Bowel movements have been normal.    No testicular pain.    No urinary symptoms.    No history of kidney stones or surgeries though medical chart reviewed does note hernia repair in the past.  Independent Historian contributed to assessment above: His wife is at bedside to corroborate information given above       Physical Exam   Triage Vital Signs: ED Triage Vitals  Encounter Vitals Group     BP 03/18/23 0138 (!) 151/90     Systolic BP Percentile --      Diastolic BP Percentile --      Pulse Rate 03/18/23 0138 (!) 109     Resp 03/18/23 0138 19     Temp 03/18/23 0138 98 F (36.7 C)     Temp src --      SpO2 03/18/23 0138 96 %     Weight 03/18/23 0138 246 lb (111.6 kg)     Height 03/18/23 0138 5\' 11"  (1.803 m)     Head Circumference --      Peak Flow --      Pain Score 03/18/23 0149 6     Pain Loc --      Pain Education --      Exclude from Growth Chart --     Most recent vital signs: Vitals:   03/18/23 0138  BP: (!) 151/90  Pulse: (!) 109  Resp: 19  Temp: 98 F (36.7 C)  SpO2: 96%    General: Awake, no distress.  CV:  Good peripheral perfusion.  Resp:  Normal effort.  Abd:  No distention.  Other:  Uncomfortable appearing retching at the bedside.  No CVA tenderness though he is tender in the right lower quadrant to deep palpation.  No rigidity or guarding.   ED Results / Procedures / Treatments   Labs (all labs ordered are listed, but only abnormal results are displayed) Labs  Reviewed  URINALYSIS, ROUTINE W REFLEX MICROSCOPIC - Abnormal; Notable for the following components:      Result Value   Color, Urine YELLOW (*)    APPearance CLEAR (*)    Ketones, ur 20 (*)    All other components within normal limits  CBC WITH DIFFERENTIAL/PLATELET - Abnormal; Notable for the following components:   WBC 14.5 (*)    Neutro Abs 11.0 (*)    All other components within normal limits  COMPREHENSIVE METABOLIC PANEL - Abnormal; Notable for the following components:   Potassium 3.3 (*)    Alkaline Phosphatase 32 (*)    All other components within normal limits  LIPASE, BLOOD     I ordered and reviewed the above labs they are notable for white blood cell count is elevated at 14.5.    RADIOLOGY I independently reviewed and interpreted CT of the abdomen pelvis and see inflammatory changes right lower quadrant suspicious for appendicitis I also reviewed radiologist's formal read.   PROCEDURES:  Critical Care performed: No  Procedures   MEDICATIONS ORDERED IN ED: Medications  cefTRIAXone (ROCEPHIN) 1 g in sodium chloride 0.9 % 100 mL IVPB (has no administration in time range)  metroNIDAZOLE (FLAGYL) IVPB 500 mg (has no administration in time range)  ondansetron (ZOFRAN) injection 4 mg (4 mg Intravenous Given 03/18/23 0239)  morphine (PF) 4 MG/ML injection 4 mg (4 mg Intravenous Given 03/18/23 0240)  ketorolac (TORADOL) 15 MG/ML injection 15 mg (15 mg Intravenous Given 03/18/23 0240)  iohexol (OMNIPAQUE) 350 MG/ML injection 100 mL (100 mLs Intravenous Contrast Given 03/18/23 0309)    External physician / consultants:  I spoke with Dr. Baker Pierini general surgery regarding care plan for this patient.   IMPRESSION / MDM / ASSESSMENT AND PLAN / ED COURSE  I reviewed the triage vital signs and the nursing notes.                                Patient's presentation is most consistent with acute presentation with potential threat to life or bodily  function.  Differential diagnosis includes, but is not limited to, appendicitis, renal colic/kidney stone, urinary tract infection, diverticulitis, colitis, viral gastroenteritis, considered but less likely testicular torsion   The patient is on the cardiac monitor to evaluate for evidence of arrhythmia and/or significant heart rate changes.  MDM:    Right lower quadrant pain in this patient with retching/vomiting, get CT abdomen pelvis to check for appendicitis or other intra-abdominal infectious or obstructive pathologies.  Give IV morphine, antiemetic.  -- Acute uncomplicated appendicitis noted on CT scan, antibiotics ordered IV ceftriaxone and Flagyl, consulted with Dr. Maurine Minister of general surgery who will admit the patient.      FINAL CLINICAL IMPRESSION(S) / ED DIAGNOSES   Final diagnoses:  Acute appendicitis, unspecified acute appendicitis type     Rx / DC Orders   ED Discharge Orders     None        Note:  This document was prepared using Dragon voice recognition software and may include unintentional dictation errors.    Pilar Jarvis, MD 03/18/23 [phone]

## 2023-03-18 NOTE — Anesthesia Preprocedure Evaluation (Signed)
Anesthesia Evaluation  Patient identified by MRN, date of birth, ID band Patient awake    Reviewed: Allergy & Precautions, NPO status , Patient's Chart, lab work & pertinent test results  Airway Mallampati: II  TM Distance: >3 FB Neck ROM: Full    Dental  (+) Teeth Intact   Pulmonary neg pulmonary ROS   Pulmonary exam normal breath sounds clear to auscultation       Cardiovascular Exercise Tolerance: Good negative cardio ROS Normal cardiovascular exam Rhythm:Regular Rate:Normal     Neuro/Psych negative neurological ROS  negative psych ROS   GI/Hepatic negative GI ROS, Neg liver ROS,,,  Endo/Other  negative endocrine ROS    Renal/GU negative Renal ROS     Musculoskeletal   Abdominal Normal abdominal exam  (+)   Peds negative pediatric ROS (+)  Hematology negative hematology ROS (+)   Anesthesia Other Findings History reviewed. No pertinent past medical history.  Past Surgical History: No date: ADENOIDECTOMY No date: HERNIA REPAIR  BMI    Body Mass Index: 34.31 kg/m      Reproductive/Obstetrics negative OB ROS                             Anesthesia Physical Anesthesia Plan  ASA: 2  Anesthesia Plan: General   Post-op Pain Management:    Induction: Intravenous  PONV Risk Score and Plan: Ondansetron, Dexamethasone, Midazolam and Treatment may vary due to age or medical condition  Airway Management Planned: Oral ETT  Additional Equipment:   Intra-op Plan:   Post-operative Plan: Extubation in OR  Informed Consent: I have reviewed the patients History and Physical, chart, labs and discussed the procedure including the risks, benefits and alternatives for the proposed anesthesia with the patient or authorized representative who has indicated his/her understanding and acceptance.     Dental Advisory Given  Plan Discussed with: CRNA and Surgeon  Anesthesia Plan  Comments:        Anesthesia Quick Evaluation

## 2023-03-18 NOTE — Op Note (Signed)
Preoperative diagnosis: Acute appendicitis Postoperative diagnosis: Same Procedure: Laparoscopic appendectomy Surgeon: Baker Pierini, MD EBL: 15 cc Specimen: Appendix to pathology Case type contaminated   After informed consent was obtained the patient was brought to the operating room placed supine on the operating room table.  General endotracheal anesthesia was induced and his abdomen was then prepped and draped in the usual sterile fashion.  A surgical timeout was called identifying correct patient, site, side and procedure.  An incision was made in the left upper quadrant at Palmer's point and a Veress needle was inserted into the peritoneum using standard drop technique.  Pneumoperitoneum was then established.  A 5 mm infraumbilical incision was made and an Optiview trocar was placed in this.  There was noted to be no injury to the site of the Veress needle insertion site.  A 5 mm suprapubic and 12 mm left lower quadrant port were then placed under direct visualization.  The right lower quadrant was examined and the appendix appeared inflamed but there is no evidence of perforation.  There was small amount of free fluid in the right lower quadrant.  The mesoappendix was grasped and the appendix was adhered to the right abdominal sidewall.  These adhesions were taken down sharply with scissors.  Once the appendix was able to be mobilized it was retracted towards the anterior abdominal wall and the base appeared healthy.  A window was made at the base of the mesentery.  The appendix was then taken with a 45 mm Endo GIA blue load standard stapler.  The mesentery was then taken with 2 firings of a 45 mm white vascular load stapler.  The right lower quadrant was then inspected and the staple line appeared intact and there was no bleeding noted.  The right lower quadrant was then suctioned out of all fluid and minimal blood.  The appendix was then placed in the Endo Catch bag and removed from the left lower  quadrant incision and sent to pathology.  The fascia of the left lower quadrant incision was then closed with 0 Vicryl using a suture needle passer.  The abdomen was then reexamined and there is no ongoing bleeding.  The abdomen was then desufflated.  The skin was then closed with 4-0 Monocryl.  Prior to termination of the procedure all sponge and instrument counts were correct x 2.  The patient was then awoken from general endotracheal anesthesia and taken to the PACU in good condition.

## 2023-03-18 NOTE — ED Triage Notes (Signed)
Patient ambulatory to triage with steady gait, without difficulty or distress noted; pt reports rt lower abd pain radiating into middle accomp by nausea; denies hx of same

## 2023-03-18 NOTE — Discharge Instructions (Signed)
In addition to included general post-operative instructions,  Diet: Resume home diet.   Activity: No heavy lifting >20 pounds (children, pets, laundry, garbage) or strenuous activity for 4 weeks, but light activity and walking are encouraged. Do not drive or drink alcohol if taking narcotic pain medications or having pain that might distract from driving.  Wound care: 2 days after surgery (11/17), you may shower/get incision wet with soapy water and pat dry (do not rub incisions), but no baths or submerging incision underwater until follow-up.   Medications: Resume all home medications. For mild to moderate pain: acetaminophen (Tylenol) or ibuprofen/naproxen (if no kidney disease). Combining Tylenol with alcohol can substantially increase your risk of causing liver disease. Narcotic pain medications, if prescribed, can be used for severe pain, though may cause nausea, constipation, and drowsiness. Do not combine Tylenol and Percocet (or similar) within a 6 hour period as Percocet (and similar) contain(s) Tylenol. If you do not need the narcotic pain medication, you do not need to fill the prescription.  Call office [phone] / [phone]) at any time if any questions, worsening pain, fevers/chills, bleeding, drainage from incision site, or other concerns.

## 2023-03-18 NOTE — H&P (Signed)
[facility] SURGICAL HISTORY & PHYSICAL (cpt [phone])  HISTORY OF PRESENT ILLNESS (HPI):  32 y.o. male presented to [facility] ED overnight for abdominal pain. Patient reports the acute onset of RLQ abdominal pain around 630 PM last night when he woke up from a nap. This was sharp in nature. He tried to sleep it off but pain persisted without relief. Associated chills and nausea. No fever, CP, SOB, emesis, no diarrhea. Does have history of inguinal hernia repair at 32 yo. Work up in the ED revealed a leukocytosis to 14.5K, Hgb to 16.3, mild hypokalemia to 3.3, sCr - 1.01, lipase normal to 37. CT Abdomen/Pelvis was concerning for acute uncomplicated appendicitis. He is on Rocephin and Flagyl.   General surgery is consulted by emergency medicine physician Dr Pilar Jarvis, MD for evaluation and management of acute appendicitis.   PAST MEDICAL HISTORY (PMH):  History reviewed. No pertinent past medical history.  Reviewed. Otherwise negative.   PAST SURGICAL HISTORY (PSH):  Past Surgical History:  Procedure Laterality Date   ADENOIDECTOMY     HERNIA REPAIR      Reviewed. Otherwise negative.   MEDICATIONS:  Prior to Admission medications   Medication Sig Start Date End Date Taking? Authorizing Provider  buPROPion (WELLBUTRIN XL) 150 MG 24 hr tablet Take 1 tablet (150 mg total) by mouth once daily 12/07/21  Yes   escitalopram (LEXAPRO) 20 MG tablet Take 1 tablet (20 mg total) by mouth once daily for 90 days 04/13/21  Yes   Semaglutide-Weight Management (WEGOVY) 1.7 MG/0.75ML SOAJ Inject 0.75 mL (1.7 mg dose) under the skin once a week. 02/10/23  Yes   escitalopram (LEXAPRO) 10 MG tablet  10/26/18   [provider]  escitalopram (LEXAPRO) 20 MG tablet Take 1 tablet (20 mg total) by mouth once daily for 90 days 08/24/21     omeprazole (PRILOSEC) 20 MG capsule Take 2 capsules (40 mg total) by mouth daily. Patient not taking: Reported on 03/18/2023 03/17/18   Tommie Sams, DO   Semaglutide-Weight Management (WEGOVY) 0.5 MG/0.5ML SOAJ Inject 0.5 mg into the skin once a week. 12/08/22     Semaglutide-Weight Management (WEGOVY) 1 MG/0.5ML SOAJ Inject 1 mg into the skin once a week. 01/11/23     Semaglutide-Weight Management (WEGOVY) 2.4 MG/0.75ML SOAJ Inject 2.4 mg into the skin once a week. 03/11/23        ALLERGIES:  No Known Allergies   SOCIAL HISTORY:  Social History   Socioeconomic History   Marital status: Married    Spouse name: Not on file   Number of children: Not on file   Years of education: Not on file   Highest education level: Not on file  Occupational History   Not on file  Tobacco Use   Smoking status: Never   Smokeless tobacco: Current    Types: Snuff  Vaping Use   Vaping status: Every Day   Substances: Nicotine, Flavoring  Substance and Sexual Activity   Alcohol use: Yes   Drug use: Never   Sexual activity: Yes  Other Topics Concern   Not on file  Social History Narrative   Not on file   Social Determinants of Health   Financial Resource Strain: Low Risk  (08/26/2022)   Received from [facility]   Overall Financial Resource Strain (CARDIA)    Difficulty of Paying Living Expenses: Not very hard  Food Insecurity: No Food Insecurity (08/26/2022)   Received from [facility]   Hunger  Vital Sign    Worried About Programme researcher, broadcasting/film/video in the Last Year: Never true    Ran Out of Food in the Last Year: Never true  Transportation Needs: No Transportation Needs (08/26/2022)   Received from [facility] - Transportation    In the past 12 months, has lack of transportation kept you from medical appointments or from getting medications?: No    Lack of Transportation (Non-Medical): No  Physical Activity: Not on file  Stress: Not on file  Social Connections: Not on file  Intimate Partner Violence: Not on file     FAMILY HISTORY:  Family History  Problem Relation Age of Onset    Healthy Mother    GER disease Father     Otherwise negative.   REVIEW OF SYSTEMS:  Review of Systems  Constitutional:  Positive for chills. Negative for fever.  Cardiovascular:  Negative for chest pain and palpitations.  Gastrointestinal:  Positive for abdominal pain and nausea. Negative for vomiting.  Genitourinary:  Negative for dysuria and urgency.  All other systems reviewed and are negative.   VITAL SIGNS:  Temp:  [98 F (36.7 C)] 98 F (36.7 C) (11/15 0138) Pulse Rate:  [109] 109 (11/15 0138) Resp:  [19] 19 (11/15 0138) BP: (151)/(90) 151/90 (11/15 0138) SpO2:  [96 %] 96 % (11/15 0138) Weight:  [111.6 kg] 111.6 kg (11/15 0138)     Height: 5\' 11"  (180.3 cm) Weight: 111.6 kg BMI (Calculated): 34.33   PHYSICAL EXAM:  Physical Exam Vitals and nursing note reviewed. Exam conducted with a chaperone present.  Constitutional:      General: He is not in acute distress.    Appearance: He is well-developed. He is not ill-appearing.     Comments: Patient resting in bed; NAD. Wife at bedside   HENT:     Head: Normocephalic and atraumatic.  Eyes:     General: No scleral icterus.    Extraocular Movements: Extraocular movements intact.  Cardiovascular:     Rate and Rhythm: Normal rate.     Heart sounds: Normal heart sounds.  Pulmonary:     Effort: Pulmonary effort is normal. No respiratory distress.     Breath sounds: Normal breath sounds.  Abdominal:     General: Abdomen is flat. There is no distension.     Palpations: Abdomen is soft.     Tenderness: There is abdominal tenderness in the right lower quadrant. There is no guarding or rebound. Positive signs include Rovsing's sign and McBurney's sign.     Comments: Abdomen is soft, tenderness in RLQ with positive McBurney's Point, non-distended  Genitourinary:    Comments: Deferred Skin:    General: Skin is warm and dry.  Neurological:     General: No focal deficit present.     Mental Status: He is alert and oriented to  person, place, and time.  Psychiatric:        Mood and Affect: Mood normal.        Behavior: Behavior normal.     INTAKE/OUTPUT:  This shift: No intake/output data recorded.  Last 2 shifts: @IOLAST2SHIFTS @  Labs:     Latest Ref Rng & Units 03/18/2023    1:43 AM 08/16/2018    6:13 PM 03/17/2018   10:15 AM  CBC  WBC 4.0 - 10.5 K/uL 14.5  6.2  6.3   Hemoglobin 13.0 - 17.0 g/dL 13.0  86.5  78.4   Hematocrit 39.0 - 52.0 % 46.9  48.1  49.3   Platelets 150 - 400 K/uL 200  175  158       Latest Ref Rng & Units 03/18/2023    1:43 AM 08/16/2018    6:13 PM 03/17/2018   10:15 AM  CMP  Glucose 70 - 99 mg/dL 95  409  811   BUN 6 - 20 mg/dL 12  12  13    Creatinine 0.61 - 1.24 mg/dL 9.14  7.82  9.56   Sodium 135 - 145 mmol/L 137  139  138   Potassium 3.5 - 5.1 mmol/L 3.3  3.9  3.8   Chloride 98 - 111 mmol/L 102  102  103   CO2 22 - 32 mmol/L 25  26  27    Calcium 8.9 - 10.3 mg/dL 9.4  9.3  9.3   Total Protein 6.5 - 8.1 g/dL 7.5  7.7  7.6   Total Bilirubin <1.2 mg/dL 0.9  0.6  0.9   Alkaline Phos 38 - 126 U/L 32  29  27   AST 15 - 41 U/L 20  25  24    ALT 0 - 44 U/L 26  37  29      Imaging studies:   CT Abdomen/Pelvis (03/18/2023) personally reviewed showing an appendix with stranding, no air, no abscess, and radiology report reviewed below: IMPRESSION: 1. Acute appendicitis without evidence of appendiceal stones or rupture. 2. Tiny hiatal hernia. 3. Mild splenomegaly. 4. Bladder appears thickened but is mostly contracted. Correlate with urinalysis for possible cystitis. 5. Mildly prominent prostate. 6. Critical Value/emergent results were called by telephone at the time of interpretation on 03/18/2023 at 3:26 am to provider [facility] , who verbally acknowledged these results.   Assessment/Plan: (ICD-10's: K35.30) 32 y.o. male with acute uncomplicated appendicitis.    - Will admit to general surgery - Plan for laparoscopic appendectomy this morning with Dr Maurine Minister pending  OR/Anesthesia availability   - All risks, benefits, and alternatives to above procedure(s) were discussed with the patient, all of his questions were answered to his expressed satisfaction, patient expresses he wishes to proceed, and informed consent was obtained.    - NPO for planned surgery  - Will start LR + KCL  - IV Abx are appropriate as ordered by EDP  - Monitor abdominal examination  - Pain control prn; antiemetics prn   - Anticipate okay for discharge home post-operatively. Will prep him for this.   All of the above findings and recommendations were discussed with the patient and his family, and all of their questions were answered to their expressed satisfaction.  -- Lynden Oxford, PA-C [facility] 03/18/2023, 8:08 AM M-F: 7am - 4pm

## 2023-03-18 NOTE — Transfer of Care (Signed)
Immediate Anesthesia Transfer of Care Note  Patient: Ricky Pratt [facility]  Procedure(s) Performed: APPENDECTOMY LAPAROSCOPIC  Patient Location: PACU  Anesthesia Type:General  Level of Consciousness: awake  Airway & Oxygen Therapy: Patient Spontanous Breathing  Post-op Assessment: Report given to RN  Post vital signs: stable  Last Vitals:  Vitals Value Taken Time  BP 162/98 03/18/23 1353  Temp    Pulse 103 03/18/23 1355  Resp    SpO2 100 % 03/18/23 1355  Vitals shown include unfiled device data.  Last Pain:  Vitals:   03/18/23 1112  TempSrc: Temporal  PainSc: 3          Complications: No notable events documented.

## 2023-03-19 ENCOUNTER — Encounter: Payer: Self-pay | Admitting: General Surgery

## 2023-03-22 LAB — SURGICAL PATHOLOGY

## 2023-03-29 ENCOUNTER — Ambulatory Visit (INDEPENDENT_AMBULATORY_CARE_PROVIDER_SITE_OTHER): Payer: BC Managed Care – PPO | Admitting: General Surgery

## 2023-03-29 ENCOUNTER — Encounter: Payer: Self-pay | Admitting: General Surgery

## 2023-03-29 VITALS — BP 123/81 | HR 91 | Temp 98.0°F | Ht 70.5 in | Wt 244.0 lb

## 2023-03-29 DIAGNOSIS — K3580 Unspecified acute appendicitis: Secondary | ICD-10-CM

## 2023-03-29 DIAGNOSIS — Z09 Encounter for follow-up examination after completed treatment for conditions other than malignant neoplasm: Secondary | ICD-10-CM

## 2023-03-29 DIAGNOSIS — K352 Acute appendicitis with generalized peritonitis, without perforation or abscess: Secondary | ICD-10-CM

## 2023-03-29 NOTE — Patient Instructions (Addendum)
Use ice several times a day to any painful areas. You may use Ibuprofen 600(3 OTC) mg 3-4 times a day with food over the next week.  May use Metamucil, 3 teaspoons, 1-2 times a day in a full glass of water to help with constipation.   GENERAL POST-OPERATIVE PATIENT INSTRUCTIONS   WOUND CARE INSTRUCTIONS: Try to keep the wound dry and avoid ointments on the wound unless directed to do so.  If the wound becomes bright red and painful or starts to drain infected material that is not clear, please contact your physician immediately.  If the wound is mildly pink and has a thick firm ridge underneath it, this is normal, and is referred to as a healing ridge.  This will resolve over the next 4-6 weeks.  BATHING: You may shower if you have been informed of this by your surgeon. However, Please do not submerge in a tub, hot tub, or pool until incisions are completely sealed or have been told by your surgeon that you may do so.  DIET:  You may eat any foods that you can tolerate.  It is a good idea to eat a high fiber diet and take in plenty of fluids to prevent constipation.  If you do become constipated you may want to take a mild laxative or take ducolax tablets on a daily basis until your bowel habits are regular.  Constipation can be very uncomfortable, along with straining, after recent surgery.  ACTIVITY:  You are encouraged to walk and engage in light activity for the next two weeks.  You should not lift more than 20 pounds for 6 weeks total after surgery as it could put you at increased risk for complications.  Twenty pounds is roughly equivalent to a plastic bag of groceries. At that time- Listen to your body when lifting, if you have pain when lifting, stop and then try again in a few days. Soreness after doing exercises or activities of daily living is normal as you get back in to your normal routine.  MEDICATIONS:  Try to take narcotic medications and anti-inflammatory medications, such as tylenol,  ibuprofen, naprosyn, etc., with food. This will minimize stomach upset from the medication.  Should you develop nausea and vomiting from the pain medication, or develop a rash, please discontinue the medication and contact your physician.  You should not drive, make important decisions, or operate machinery when taking narcotic pain medication.  SUNBLOCK Use sun block to incision area over the next year if this area will be exposed to sun. This helps decrease scarring and will allow you avoid a permanent darkened area over your incision.  QUESTIONS:  Please feel free to call our office if you have any questions, and we will be glad to assist you. [phone]

## 2023-04-06 ENCOUNTER — Encounter: Payer: BC Managed Care – PPO | Admitting: Physician Assistant

## 2023-04-06 NOTE — Progress Notes (Signed)
He returns status post laparoscopic appendectomy.  He reports that he has significant pain in his lower abdomen with bruising.  He is tolerating a diet and having bowel function.  He is requiring Tylenol and ibuprofen for pain.  On exam he has diffuse bruising along the lower abdomen from the left to right.  He is tender to palpation in mostly the left lower quadrant incision.  There is no surrounding cellulitis.  There is some induration tissue under this.  Pathology consistent with acute appendicitis  Patient status post laparoscopic appendectomy.  He returns due to abdominal pain and bruising.  He does have a significant amount of bruising but there is no immediate postoperative complications.  I told him that he should continue to use as needed Tylenol and ibuprofen.  He can place ice over the bruise to help it heal.  There is no evidence of hernia at his largest port site in the left lower quadrant.  I will plan to see him back in 4 weeks to ensure that there is resolution of his symptoms.

## 2023-06-28 ENCOUNTER — Other Ambulatory Visit: Payer: Self-pay

## 2023-06-28 MED ORDER — WEGOVY 2.4 MG/0.75ML ~~LOC~~ SOAJ
2.4000 mg | SUBCUTANEOUS | 1 refills | Status: AC
  Filled 2023-06-28 – 2023-08-08 (×5): qty 3, 28d supply, fill #0

## 2023-07-07 ENCOUNTER — Other Ambulatory Visit: Payer: Self-pay

## 2023-07-11 ENCOUNTER — Other Ambulatory Visit: Payer: Self-pay

## 2023-07-13 ENCOUNTER — Other Ambulatory Visit: Payer: Self-pay

## 2023-07-21 ENCOUNTER — Other Ambulatory Visit: Payer: Self-pay

## 2023-07-21 MED ORDER — ZEPBOUND 12.5 MG/0.5ML ~~LOC~~ SOAJ
0.5000 mL | SUBCUTANEOUS | 0 refills | Status: DC
  Filled 2023-07-21 – 2023-08-04 (×2): qty 2, 28d supply, fill #0

## 2023-08-02 ENCOUNTER — Other Ambulatory Visit: Payer: Self-pay

## 2023-08-04 ENCOUNTER — Other Ambulatory Visit: Payer: Self-pay

## 2023-08-08 ENCOUNTER — Other Ambulatory Visit: Payer: Self-pay

## 2023-08-25 ENCOUNTER — Other Ambulatory Visit: Payer: Self-pay

## 2023-08-25 MED ORDER — ZEPBOUND 15 MG/0.5ML ~~LOC~~ SOAJ
15.0000 mg | SUBCUTANEOUS | 0 refills | Status: DC
  Filled 2023-08-25 – 2023-08-30 (×2): qty 2, 28d supply, fill #0

## 2023-08-30 ENCOUNTER — Other Ambulatory Visit: Payer: Self-pay

## 2023-09-23 ENCOUNTER — Other Ambulatory Visit: Payer: Self-pay

## 2023-09-23 MED ORDER — ZEPBOUND 15 MG/0.5ML ~~LOC~~ SOAJ
15.0000 mg | SUBCUTANEOUS | 5 refills | Status: DC
  Filled 2023-09-23: qty 2, 28d supply, fill #0
  Filled 2023-10-22 – 2023-11-23 (×3): qty 2, 28d supply, fill #1
  Filled 2023-12-16: qty 2, 28d supply, fill #2
  Filled 2024-01-18: qty 2, 28d supply, fill #3
  Filled 2024-02-17: qty 2, 28d supply, fill #4
  Filled 2024-03-15: qty 2, 28d supply, fill #5

## 2023-10-23 ENCOUNTER — Other Ambulatory Visit: Payer: Self-pay

## 2023-10-24 ENCOUNTER — Other Ambulatory Visit: Payer: Self-pay

## 2023-10-27 ENCOUNTER — Other Ambulatory Visit: Payer: Self-pay

## 2023-11-23 ENCOUNTER — Other Ambulatory Visit: Payer: Self-pay

## 2023-12-16 ENCOUNTER — Other Ambulatory Visit: Payer: Self-pay

## 2024-01-18 ENCOUNTER — Other Ambulatory Visit: Payer: Self-pay

## 2024-02-17 ENCOUNTER — Other Ambulatory Visit: Payer: Self-pay

## 2024-03-15 ENCOUNTER — Other Ambulatory Visit: Payer: Self-pay

## 2024-04-11 ENCOUNTER — Other Ambulatory Visit (HOSPITAL_COMMUNITY): Payer: Self-pay

## 2024-04-11 MED ORDER — ZEPBOUND 15 MG/0.5ML ~~LOC~~ SOAJ
SUBCUTANEOUS | 5 refills | Status: AC
  Filled 2024-04-11 – 2024-04-12 (×2): qty 2, 28d supply, fill #0
  Filled 2024-05-31: qty 2, 28d supply, fill #1

## 2024-04-12 ENCOUNTER — Other Ambulatory Visit (HOSPITAL_COMMUNITY): Payer: Self-pay

## 2024-04-12 ENCOUNTER — Other Ambulatory Visit: Payer: Self-pay

## 2024-04-20 ENCOUNTER — Other Ambulatory Visit (HOSPITAL_COMMUNITY): Payer: Self-pay

## 2024-05-31 ENCOUNTER — Other Ambulatory Visit: Payer: Self-pay
